# Patient Record
Sex: Female | Born: 2000 | Race: Black or African American | Hispanic: No | Marital: Single | Smoking: Never smoker
Health system: Southern US, Community
[De-identification: ages and names within clinical notes are randomized; demographics above are authoritative.]

## PROBLEM LIST (undated history)

## (undated) DIAGNOSIS — L309 Dermatitis, unspecified: Secondary | ICD-10-CM

## (undated) DIAGNOSIS — J9801 Acute bronchospasm: Secondary | ICD-10-CM

---
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
## 2020-01-03 ENCOUNTER — Other Ambulatory Visit: Payer: Self-pay

## 2020-01-03 ENCOUNTER — Emergency Department (HOSPITAL_COMMUNITY)
Admission: EM | Admit: 2020-01-03 | Discharge: 2020-01-04 | Disposition: A | Discharge status: Home / Self Care | Payer: Medicaid Other | Attending: Emergency Medicine | Admitting: Emergency Medicine

## 2020-01-03 ENCOUNTER — Encounter (HOSPITAL_COMMUNITY): Payer: Self-pay | Admitting: Emergency Medicine

## 2020-01-03 DIAGNOSIS — R350 Frequency of micturition: Secondary | ICD-10-CM | POA: Insufficient documentation

## 2020-01-03 DIAGNOSIS — Z7982 Long term (current) use of aspirin: Secondary | ICD-10-CM | POA: Diagnosis not present

## 2020-01-03 DIAGNOSIS — M7918 Myalgia, other site: Secondary | ICD-10-CM | POA: Diagnosis present

## 2020-01-03 DIAGNOSIS — B349 Viral infection, unspecified: Secondary | ICD-10-CM | POA: Insufficient documentation

## 2020-01-03 DIAGNOSIS — M545 Low back pain, unspecified: Secondary | ICD-10-CM | POA: Diagnosis not present

## 2020-01-03 LAB — URINALYSIS, ROUTINE W REFLEX MICROSCOPIC
Bilirubin Urine: NEGATIVE
Glucose, UA: NEGATIVE mg/dL
Hgb urine dipstick: NEGATIVE
Ketones, ur: NEGATIVE mg/dL
Leukocytes,Ua: NEGATIVE
Nitrite: NEGATIVE
Protein, ur: NEGATIVE mg/dL
Specific Gravity, Urine: 1.016 (ref 1.005–1.030)
pH: 8 (ref 5.0–8.0)

## 2020-01-03 LAB — I-STAT BETA HCG BLOOD, ED (MC, WL, AP ONLY): I-stat hCG, quantitative: <5 m[IU]/mL (ref <5)

## 2020-01-03 MED ORDER — KETOROLAC TROMETHAMINE 30 MG/ML IJ SOLN
30.0000 mg | Freq: Once | INTRAMUSCULAR | Status: AC
  Administered 2020-01-04: 30 mg via INTRAVENOUS
  Filled 2020-01-03: qty 1

## 2020-01-03 MED ORDER — ACETAMINOPHEN 325 MG PO TABS
650.0000 mg | ORAL_TABLET | Freq: Once | ORAL | Status: AC | PRN
  Administered 2020-01-03: 650 mg via ORAL
  Filled 2020-01-03: qty 2

## 2020-01-03 MED ORDER — OXYCODONE-ACETAMINOPHEN 5-325 MG PO TABS: 1.0000 | ORAL_TABLET | ORAL | Status: DC | PRN

## 2020-01-03 MED ORDER — SODIUM CHLORIDE 0.9 % IV BOLUS
1000.0000 mL | Freq: Once | INTRAVENOUS | Status: AC
  Administered 2020-01-04: 1000 mL via INTRAVENOUS

## 2020-01-03 NOTE — ED Triage Notes (Signed)
Pt states that she has had L sided flank pain and body aches since this morning. States she has had kidney infection in the past and this feels similar. Alert and oriented.

## 2020-01-03 NOTE — ED Provider Notes (Signed)
[facility]-EMERGENCY DEPT Provider Note   CSN: [id number] Arrival date & time: 01/03/20  1224     History Chief Complaint  Patient presents with  . Flank Pain    Andrea Tanner is a 19 y.o. female.  Patient presents to the emergency department with a chief complaint of generalized body aches.  She states the symptoms started earlier this morning.  She states pain is mostly in her left low back.  She also reports urinary frequency.  She denies having had a kidney stone before.  She states that she has had pyelonephritis in the past, and reports that this feels similar.  She denies any cough or cold symptoms.  Denies any known exposures to Covid-19.  She is unvaccinated against Covid.  She received Percocet in triage.  The history is provided by the patient. No language interpreter was used.       Past Medical History:  Diagnosis Date  . Bronchial spasms   . Eczema     Patient Active Problem List   Diagnosis Date Noted  . Acute pyelonephritis 11/25/2018  . Sepsis (HCC) 11/25/2018    History reviewed. No pertinent surgical history.   OB History   No obstetric history on file.     History reviewed. No pertinent family history.  Social History   Tobacco Use  . Smoking status: Never Smoker  . Smokeless tobacco: Never Used  Substance Use Topics  . Alcohol use: No  . Drug use: No    Home Medications Prior to Admission medications   Medication Sig Start Date End Date Taking? Authorizing Provider  albuterol (VENTOLIN HFA) 108 (90 Base) MCG/ACT inhaler Inhale 2 puffs into the lungs every 6 (six) hours as needed. 07/18/19   Menshew, Charlesetta Ivory, PA-C  Aspirin-Salicylamide-Caffeine (BC HEADACHE POWDER PO) Take 1 packet by mouth daily as needed (headache).    [provider]  ibuprofen (ADVIL) 200 MG tablet Take 400-800 mg by mouth every 6 (six) hours as needed for fever or mild pain.    [provider]  Multiple Vitamin  (MULTIVITAMIN WITH MINERALS) TABS tablet Take 1 tablet by mouth daily.    [provider]  ondansetron (ZOFRAN ODT) 4 MG disintegrating tablet Take 1 tablet (4 mg total) by mouth every 8 (eight) hours as needed for nausea or vomiting. 11/06/19   Petrucelli, Samantha R, PA-C  pantoprazole (PROTONIX) 40 MG tablet Take 1 tablet (40 mg total) by mouth daily. 11/06/19   Petrucelli, Samantha R, PA-C  sucralfate (CARAFATE) 1 g tablet Take 1 tablet (1 g total) by mouth 4 (four) times daily -  with meals and at bedtime. 11/06/19   Petrucelli, Samantha R, PA-C  fluticasone (FLONASE) 50 MCG/ACT nasal spray Place 2 sprays into both nostrils daily. Patient not taking: Reported on 11/05/2019 07/18/19 11/06/19  Menshew, Charlesetta Ivory, PA-C    Allergies    Red dye and Sulfa antibiotics  Review of Systems   Review of Systems  All other systems reviewed and are negative.   Physical Exam Updated Vital Signs BP 123/73 (BP Location: Left Arm)   Pulse (!) 141   Temp (!) 101.1 F (38.4 C) (Oral)   Resp 20   Ht 5\' 6"  (1.676 m)   Wt 68 kg   SpO2 99%   BMI 24.21 kg/m   Physical Exam Vitals and nursing note reviewed.  Constitutional:      General: She is not in acute distress.    Appearance: She is  well-developed and well-nourished.  HENT:     Head: Normocephalic and atraumatic.  Eyes:     Conjunctiva/sclera: Conjunctivae normal.  Cardiovascular:     Rate and Rhythm: Regular rhythm.     Heart sounds: No murmur heard.   Pulmonary:     Effort: Pulmonary effort is normal. No respiratory distress.     Breath sounds: Normal breath sounds.  Abdominal:     Palpations: Abdomen is soft.     Tenderness: There is no abdominal tenderness.  Musculoskeletal:        General: No edema. Normal range of motion.     Cervical back: Neck supple.  Skin:    General: Skin is warm and dry.  Neurological:     Mental Status: She is alert and oriented to person, place, and time.  Psychiatric:        Mood and  Affect: Mood and affect and mood normal.        Behavior: Behavior normal.     ED Results / Procedures / Treatments   Labs (all labs ordered are listed, but only abnormal results are displayed) Labs Reviewed  RESP PANEL BY RT-PCR (FLU A&B, COVID) [id number]  URINALYSIS, ROUTINE W REFLEX MICROSCOPIC  CBC WITH DIFFERENTIAL/PLATELET  BASIC METABOLIC PANEL  I-STAT BETA HCG BLOOD, ED ([facility], [facility], [facility] ONLY)    EKG None  Radiology No results found.  Procedures Procedures (including critical care time)  Medications Ordered in ED Medications  sodium chloride 0.9 % bolus 1,000 mL (has no administration in time range)  ketorolac (TORADOL) 30 MG/ML injection 30 mg (has no administration in time range)  acetaminophen (TYLENOL) tablet 650 mg (650 mg Oral Given 01/03/20 1300)    ED Course  I have reviewed the triage vital signs and the nursing notes.  Pertinent labs & imaging results that were available during my care of the patient were reviewed by me and considered in my medical decision making (see chart for details).    MDM Rules/Calculators/A&P                          This patient complains of fever and body aches, this involves an extensive number of treatment options, and is a complaint that carries with it a high risk of complications and morbidity.    Differential Dx Covid, flu, UTI  Pertinent Labs I ordered, reviewed, and interpreted labs, which included CBC, BMP, covid, UA, and HCG.   12:01 AM Patient refuses covid test.  Discussed that without this, we will not know for sure if she has COVID or flu.  Patient acknowledges this.  Labs are reassuring.  Plan Discharge with outpatient follow-up.  Suspect COVID or flu, but patient doesn't consent to testing. Non-toxic in appearance.  Stable for discharge.    Final Clinical Impression(s) / ED Diagnoses Final diagnoses:  Viral illness    Rx / DC Orders ED Discharge Orders    None       Roxy Horseman, PA-C 01/04/20  0043    Ward, Layla Maw, DO 01/04/20 0107

## 2020-01-04 LAB — CBC WITH DIFFERENTIAL/PLATELET
Abs Immature Granulocytes: 0.02 10*3/uL (ref 0.00–0.07)
Basophils Absolute: 0 10*3/uL (ref 0.0–0.1)
Basophils Relative: 0 %
Eosinophils Absolute: 0 10*3/uL (ref 0.0–0.5)
Eosinophils Relative: 0 %
HCT: 40.6 % (ref 36.0–46.0)
Hemoglobin: 13.4 g/dL (ref 12.0–15.0)
Immature Granulocytes: 0 %
Lymphocytes Relative: 14 %
Lymphs Abs: 0.9 10*3/uL (ref 0.7–4.0)
MCH: 28 pg (ref 26.0–34.0)
MCHC: 33 g/dL (ref 30.0–36.0)
MCV: 84.8 fL (ref 80.0–100.0)
Monocytes Absolute: 1.4 10*3/uL — ABNORMAL HIGH (ref 0.1–1.0)
Monocytes Relative: 23 %
Neutro Abs: 3.8 10*3/uL (ref 1.7–7.7)
Neutrophils Relative %: 63 %
Platelets: 177 10*3/uL (ref 150–400)
RBC: 4.79 MIL/uL (ref 3.87–5.11)
RDW: 13.2 % (ref 11.5–15.5)
WBC: 6 10*3/uL (ref 4.0–10.5)
nRBC: 0 % (ref 0.0–0.2)

## 2020-01-04 LAB — BASIC METABOLIC PANEL
Anion gap: 11 (ref 5–15)
BUN: 8 mg/dL (ref 6–20)
CO2: 22 mmol/L (ref 22–32)
Calcium: 9.4 mg/dL (ref 8.9–10.3)
Chloride: 103 mmol/L (ref 98–111)
Creatinine, Ser: 0.63 mg/dL (ref 0.44–1.00)
GFR, Estimated: >60 mL/min (ref >60)
Glucose, Bld: 95 mg/dL (ref 70–99)
Potassium: 3.9 mmol/L (ref 3.5–5.1)
Sodium: 136 mmol/L (ref 135–145)

## 2020-01-04 NOTE — Discharge Instructions (Addendum)
No clear cause of your symptoms was identified tonight.  Your urinalysis and blood work were normal.  Given that you have fever and generalized body aches, I am concerned that you may have Covid or flu.  You declined testing for these illnesses tonight.  Please quarantine yourself at home for 5 days.

## 2020-01-04 NOTE — ED Notes (Signed)
Pt refused Resp panel.

## 2021-02-23 IMAGING — CR DG ABDOMEN ACUTE W/ 1V CHEST
3 series · 3 of 3 positions shown · non-contrast
Comparison: 02/15/2015

CLINICAL DATA: Mid abdominal pain, nausea

EXAM:
DG ABDOMEN ACUTE WITH 1 VIEW CHEST

[w chest pa]
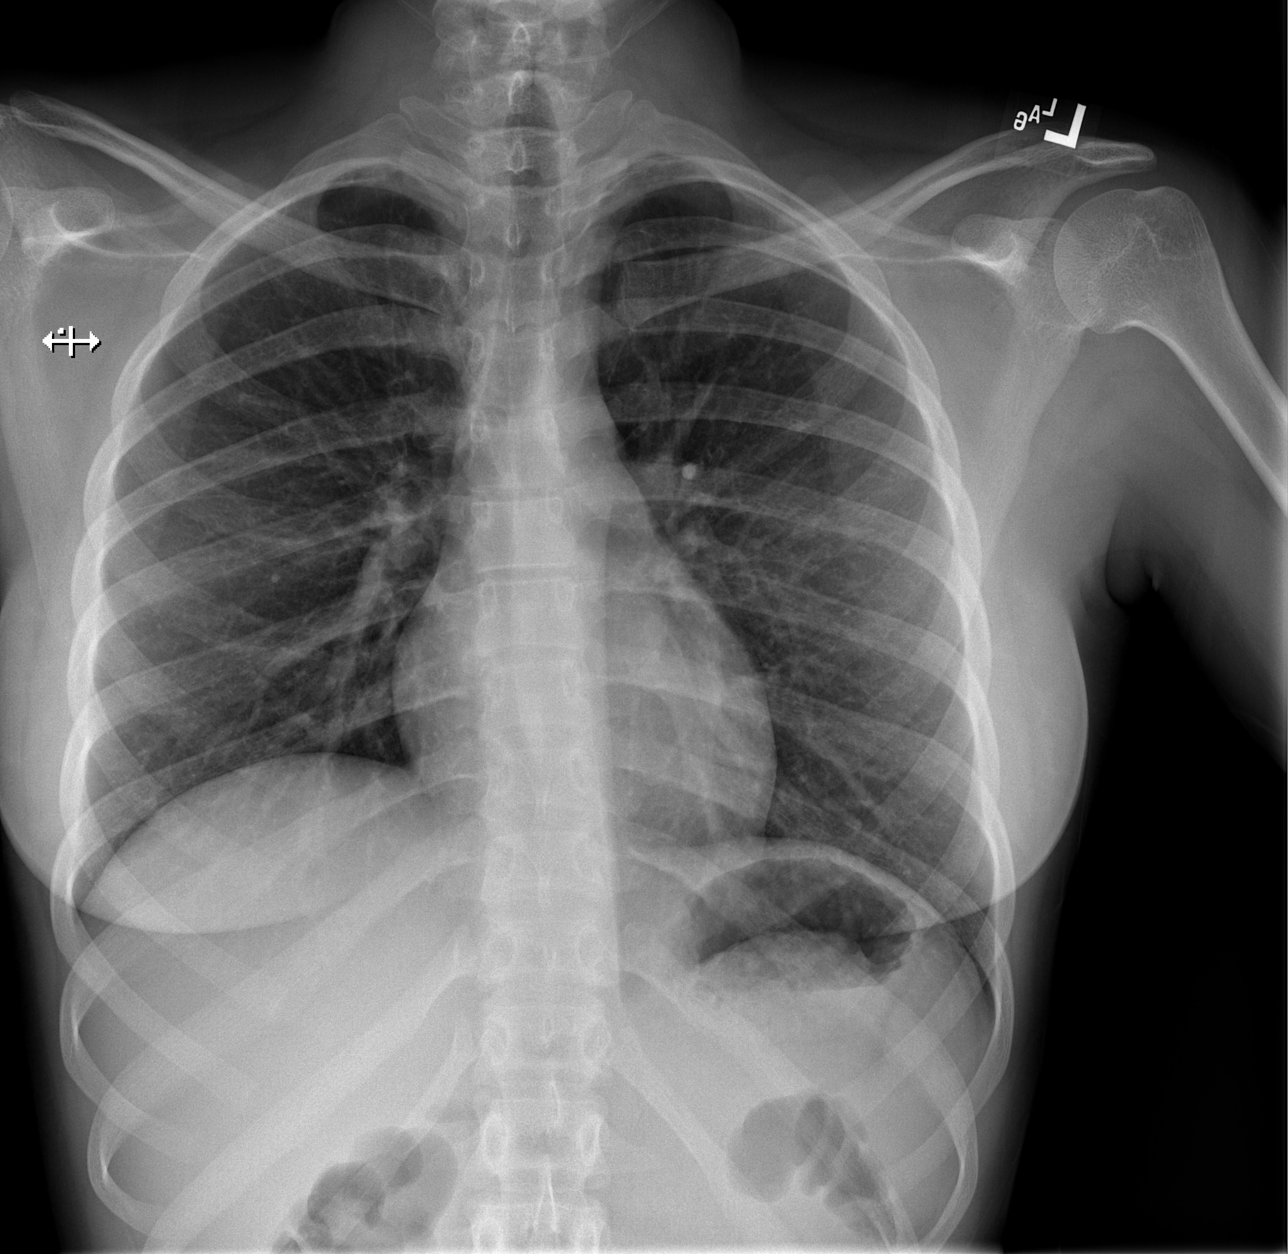

[w abdomen upright]
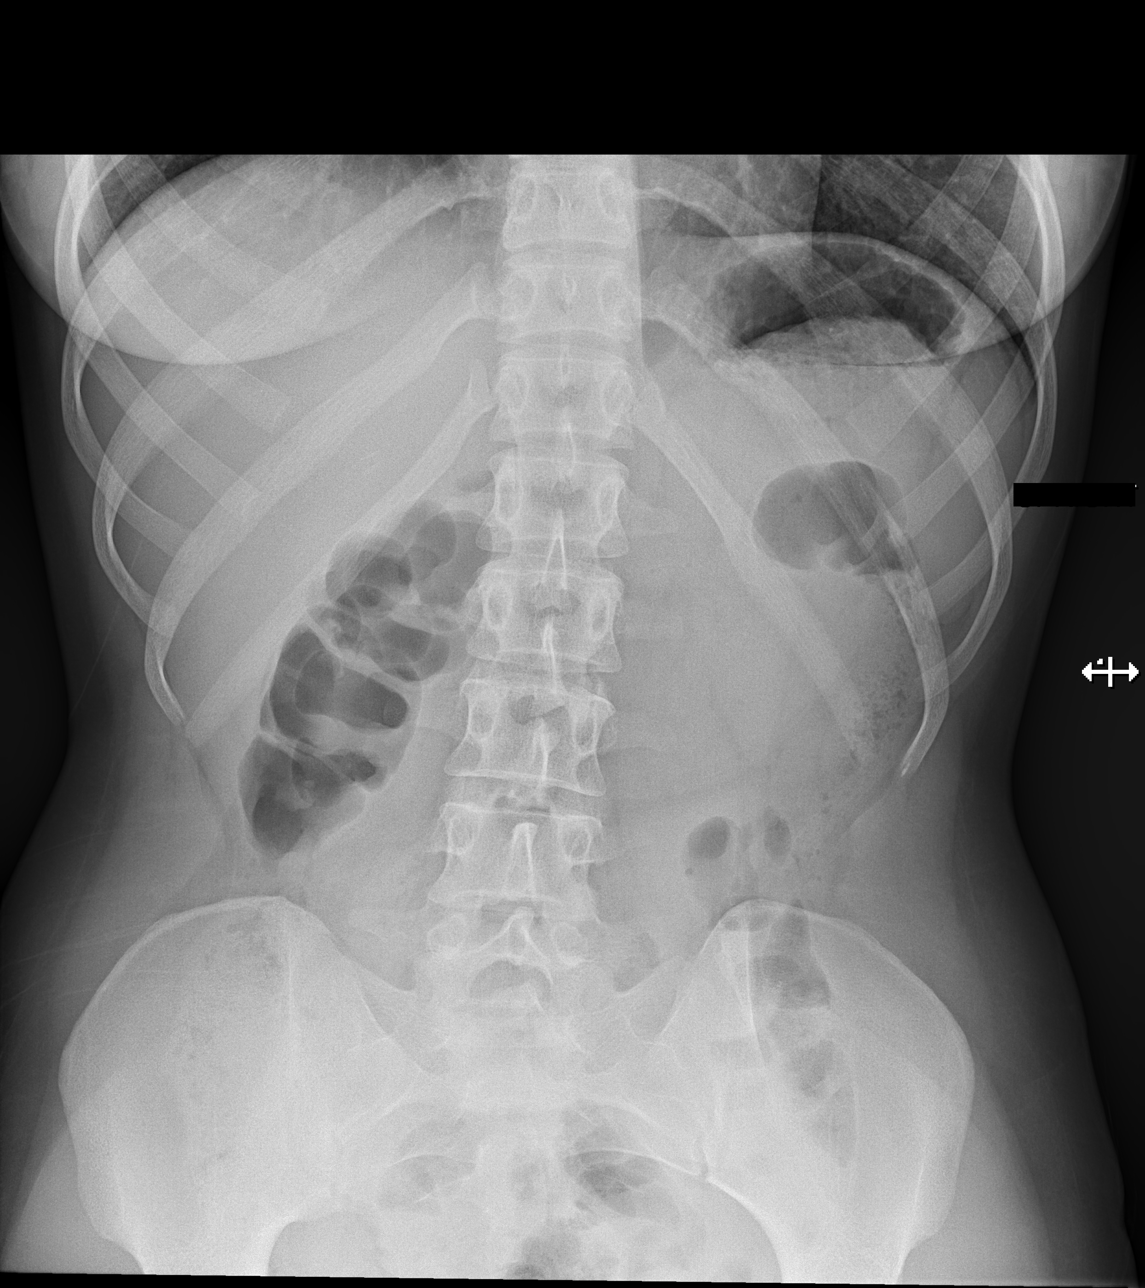

[t abdomen supine]
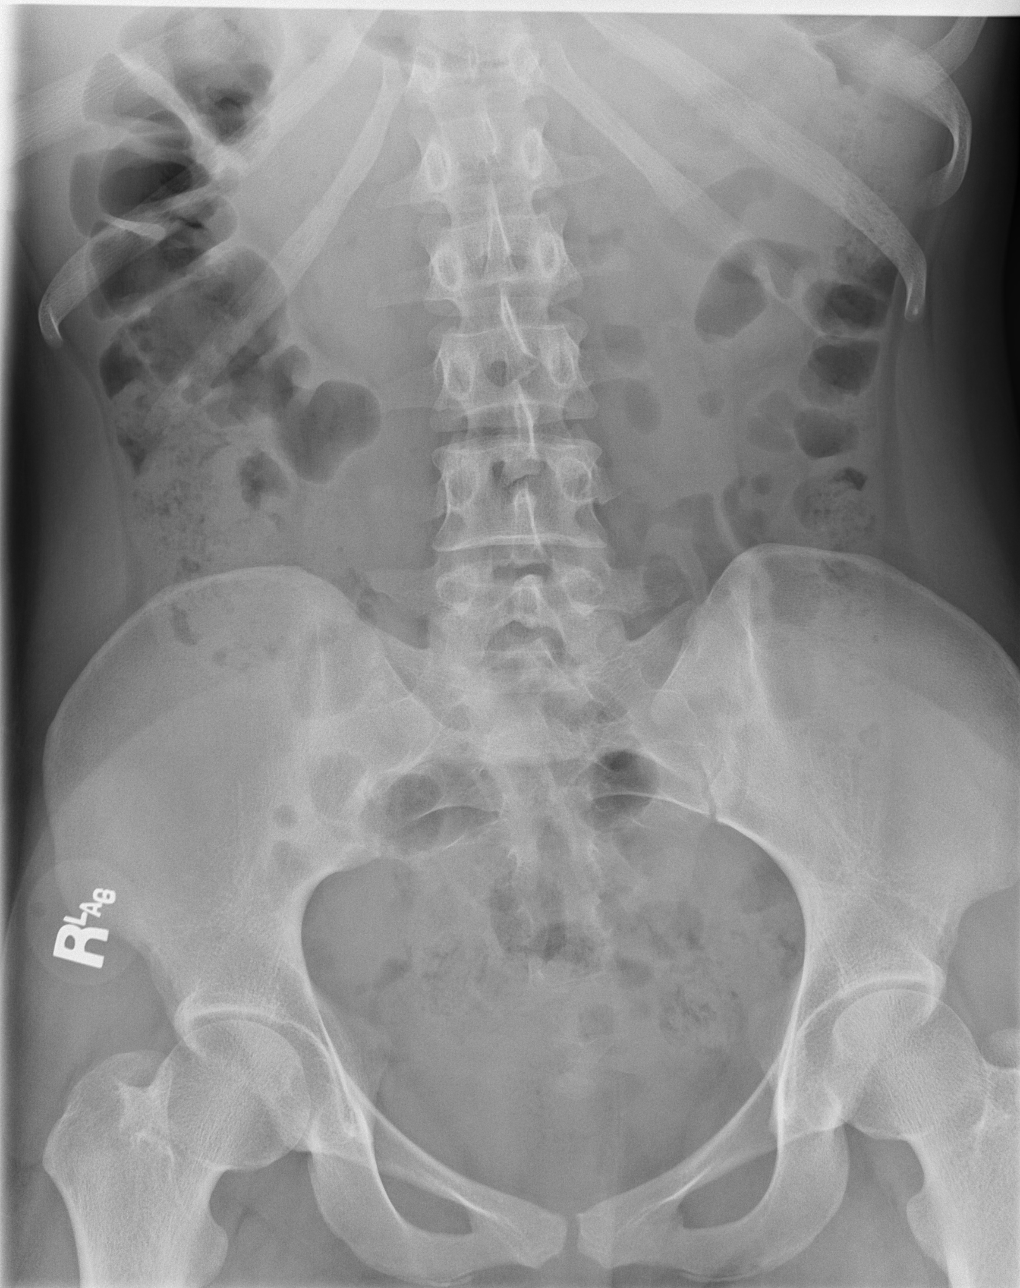

[3 of 3 positions shown; findings below may reference images not displayed]

FINDINGS: There is no evidence of dilated bowel loops or free intraperitoneal
air. No radiopaque calculi or other significant radiographic
abnormality is seen. Heart size and mediastinal contours are within
normal limits. Both lungs are clear.
IMPRESSION: Negative abdominal radiographs.  No acute cardiopulmonary disease.
